# Patient Record
Sex: Male | Born: 1965 | Race: White | Hispanic: No | Marital: Married | Smoking: Never smoker
Health system: Southern US, Community
[De-identification: ages and names within clinical notes are randomized; demographics above are authoritative.]

## PROBLEM LIST (undated history)

## (undated) DIAGNOSIS — M1990 Unspecified osteoarthritis, unspecified site: Secondary | ICD-10-CM

## (undated) HISTORY — PX: KNEE SURGERY: SHX244

---

## 2016-11-07 DIAGNOSIS — Z23 Encounter for immunization: Secondary | ICD-10-CM | POA: Diagnosis not present

## 2017-10-21 ENCOUNTER — Emergency Department (INDEPENDENT_AMBULATORY_CARE_PROVIDER_SITE_OTHER): Payer: BLUE CROSS/BLUE SHIELD

## 2017-10-21 ENCOUNTER — Encounter: Payer: Self-pay | Admitting: Emergency Medicine

## 2017-10-21 ENCOUNTER — Emergency Department
Admission: EM | Admit: 2017-10-21 | Discharge: 2017-10-21 | Disposition: A | Discharge status: Home / Self Care | Payer: BLUE CROSS/BLUE SHIELD | Source: Home / Self Care | Attending: Family Medicine | Admitting: Family Medicine

## 2017-10-21 DIAGNOSIS — M5412 Radiculopathy, cervical region: Secondary | ICD-10-CM | POA: Diagnosis not present

## 2017-10-21 DIAGNOSIS — M4802 Spinal stenosis, cervical region: Secondary | ICD-10-CM | POA: Diagnosis not present

## 2017-10-21 HISTORY — DX: Unspecified osteoarthritis, unspecified site: M19.90

## 2017-10-21 MED ORDER — PREDNISONE 20 MG PO TABS: ORAL_TABLET | ORAL | 0 refills | Status: DC

## 2017-10-21 NOTE — Discharge Instructions (Addendum)
Apply ice to the affected area: °Put ice in a plastic bag. °Place a towel between your skin and the bag. °Leave the ice on for 20 minutes, 2-3 times per day. °

## 2017-10-21 NOTE — ED Triage Notes (Signed)
Pt c/o shoulder pain x1 week. States he has hx of shoulder pain but has not been seen for it in years. Worse and constant in the last week. Denies injury.

## 2017-10-21 NOTE — ED Provider Notes (Signed)
[facility]    CSN: [id number] Arrival date & time: 10/21/17  1191     History   Chief Complaint Chief Complaint  Patient presents with  . Shoulder Pain    HPI Tyrone Williams is a 52 y.o. male.   Patient complains of pain in his left shoulder area for about 1.5 weeks.  He recalls no injury.  He notes that dull pain occurs in his left shoulder and upper back when he extends his neck, flexes his neck to the left, and when her rotates his head to the left.  He has occasional radiation of the pain to his posterior humerus and elbow.   He has a past history of left rotator cuff pain that improved spontaneously.  The history is provided by the patient.  Shoulder Pain  Location:  Shoulder Shoulder location:  L shoulder Injury: no   Pain details:    Quality:  Aching   Radiates to:  L upper arm   Severity:  Mild   Onset quality:  Gradual   Duration:  10 days   Timing:  Intermittent   Progression:  Worsening Handedness:  Left-handed Relieved by:  Nothing Worsened by:  Movement Ineffective treatments:  NSAIDs Associated symptoms: back pain   Associated symptoms: no decreased range of motion, no fatigue, no fever, no muscle weakness, no neck pain, no numbness, no stiffness, no swelling and no tingling     Past Medical History:  Diagnosis Date  . Arthritis     There are no active problems to display for this patient.   Past Surgical History:  Procedure Laterality Date  . KNEE SURGERY         Home Medications    Prior to Admission medications   Medication Sig Start Date End Date Taking? Authorizing Provider  predniSONE (DELTASONE) 20 MG tablet Take one tab by mouth twice daily for 5 days, then one daily for 3 days. Take with food. 10/21/17   Lattie Haw, MD    Family History History reviewed. No pertinent family history.  Social History Social History   Tobacco Use  . Smoking status: Never Smoker  . Smokeless tobacco: Current User    Types:  Snuff  Substance Use Topics  . Alcohol use: Not Currently  . Drug use: Never     Allergies   Patient has no allergy information on record.   Review of Systems Review of Systems  Constitutional: Negative for fatigue, fever and unexpected weight change.  Musculoskeletal: Positive for back pain. Negative for neck pain and stiffness.  All other systems reviewed and are negative.    Physical Exam Triage Vital Signs ED Triage Vitals  Enc Vitals Group     BP 10/21/17 0849 (!) 155/92     Pulse Rate 10/21/17 0849 68     Resp --      Temp 10/21/17 0849 97.8 F (36.6 C)     Temp Source 10/21/17 0849 Oral     SpO2 10/21/17 0849 98 %     Weight 10/21/17 0850 254 lb (115.2 kg)     Height --      Head Circumference --      Peak Flow --      Pain Score --      Pain Loc --      Pain Edu? --      Excl. in GC? --    No data found.  Updated Vital Signs BP (!) 155/92 (BP Location: Right Arm)  Pulse 68   Temp 97.8 F (36.6 C) (Oral)   Wt 115.2 kg   SpO2 98%   Visual Acuity Right Eye Distance:   Left Eye Distance:   Bilateral Distance:    Right Eye Near:   Left Eye Near:    Bilateral Near:     Physical Exam  Constitutional: He appears well-developed and well-nourished. No distress.  HENT:  Head: Atraumatic.  Right Ear: External ear normal.  Left Ear: External ear normal.  Eyes: Pupils are equal, round, and reactive to light.  Neck: Normal range of motion.  Cardiovascular: Normal rate and normal heart sounds.  Pulmonary/Chest: Effort normal and breath sounds normal.  Musculoskeletal:       Left shoulder: Normal. He exhibits normal range of motion and no tenderness.       Arms: Left shoulder has good internal/external rotation, and normal abduction.  Negative Apley's and empty can tests.  No shoulder tenderness to palpation.  Distal neurovascular function is intact.   Lymphadenopathy:    He has no cervical adenopathy.  Neurological: He is alert.  Skin: Skin is warm  and dry. No rash noted.  Nursing note and vitals reviewed.    UC Treatments / Results  Labs (all labs ordered are listed, but only abnormal results are displayed) Labs Reviewed - No data to display  EKG None  Radiology Dg Cervical Spine Complete  Result Date: 10/21/2017 CLINICAL DATA:  Left upper extremity pain EXAM: CERVICAL SPINE - COMPLETE 4+ VIEW COMPARISON:  None. FINDINGS: Uncovertebral spurring causes mild to moderate left neural foraminal narrowing from C3-4 through C6-7 and mild right neural foraminal narrowing at C3-4 and C4-5. Degenerative disc disease at C4-5 and C5-6 with disc space narrowing and spurring. Mild bilateral degenerative facet disease, left greater than right. Normal alignment. No fracture. Prevertebral soft tissues are normal. IMPRESSION: Bilateral neural foraminal narrowing, left greater than right. Degenerative disc and facet disease. No acute bony abnormality. Electronically Signed   By: Charlett Nose M.D.   On: 10/21/2017 09:28    Procedures Procedures (including critical care time)  Medications Ordered in UC Medications - No data to display  Initial Impression / Assessment and Plan / UC Course  I have reviewed the triage vital signs and the nursing notes.  Pertinent labs & imaging results that were available during my care of the patient were reviewed by me and considered in my medical decision making (see chart for details).    Begin prednisone burst/taper. Followup with Dr. Rodney Langton or Dr. Clementeen Graham ([facility]) for further evaluation.   Final Clinical Impressions(s) / UC Diagnoses   Final diagnoses:  Cervical radiculopathy     Discharge Instructions      Apply ice to the affected area. ? Put ice in a plastic bag. ? Place a towel between your skin and the bag. ? Leave the ice on for 20 minutes, 2-3 times per day.    ED Prescriptions    Medication Sig Dispense Auth. Provider   predniSONE (DELTASONE) 20 MG  tablet Take one tab by mouth twice daily for 5 days, then one daily for 3 days. Take with food. 13 tablet Lattie Haw, MD         Lattie Haw, MD 10/21/17 1002

## 2017-10-28 ENCOUNTER — Other Ambulatory Visit: Payer: Self-pay

## 2017-10-28 ENCOUNTER — Encounter: Payer: Self-pay | Admitting: Family Medicine

## 2017-10-28 ENCOUNTER — Ambulatory Visit: Payer: BLUE CROSS/BLUE SHIELD | Admitting: Family Medicine

## 2017-10-28 VITALS — BP 142/79 | HR 92 | Ht 72.0 in | Wt 252.0 lb

## 2017-10-28 DIAGNOSIS — M5412 Radiculopathy, cervical region: Secondary | ICD-10-CM | POA: Diagnosis not present

## 2017-10-28 MED ORDER — GABAPENTIN 300 MG PO CAPS: ORAL_CAPSULE | ORAL | 3 refills | Status: DC

## 2017-10-28 MED ORDER — PREDNISONE 10 MG (48) PO TBPK: ORAL_TABLET | Freq: Every day | ORAL | 0 refills | Status: DC

## 2017-10-28 MED ORDER — GABAPENTIN 300 MG PO CAPS: ORAL_CAPSULE | ORAL | 3 refills | Status: AC

## 2017-10-28 NOTE — Patient Instructions (Signed)
Thank you for coming in today. Attend PT.  Do home exercises.  Restart prednisone  Start gabapentin.  Recheck with me in 4 weeks.  Return sooner if not doing well.  Come back or go to the emergency room if you notice new weakness new numbness problems walking or bowel or bladder problems.   Cervical Radiculopathy Cervical radiculopathy happens when a nerve in the neck (cervical nerve) is pinched or bruised. This condition can develop because of an injury or as part of the normal aging process. Pressure on the cervical nerves can cause pain or numbness that runs from the neck all the way down into the arm and fingers. Usually, this condition gets better with rest. Treatment may be needed if the condition does not improve. What are the causes? This condition may be caused by:  Injury.  Slipped (herniated) disk.  Muscle tightness in the neck because of overuse.  Arthritis.  Breakdown or degeneration in the bones and joints of the spine (spondylosis) due to aging.  Bone spurs that may develop near the cervical nerves.  What are the signs or symptoms? Symptoms of this condition include:  Pain that runs from the neck to the arm and hand. The pain can be severe or irritating. It may be worse when the neck is moved.  Numbness or weakness in the affected arm and hand.  How is this diagnosed? This condition may be diagnosed based on symptoms, medical history, and a physical exam. You may also have tests, including:  X-rays.  CT scan.  MRI.  Electromyogram (EMG).  Nerve conduction tests.  How is this treated? In many cases, treatment is not needed for this condition. With rest, the condition usually gets better over time. If treatment is needed, options may include:  Wearing a soft neck collar for short periods of time.  Physical therapy to strengthen your neck muscles.  Medicines, such as NSAIDs, oral corticosteroids, or spinal injections.  Surgery. This may be needed if  other treatments do not help. Various types of surgery may be done depending on the cause of your problems.  Follow these instructions at home: Managing pain  Take over-the-counter and prescription medicines only as told by your health care provider.  If directed, apply ice to the affected area. ? Put ice in a plastic bag. ? Place a towel between your skin and the bag. ? Leave the ice on for 20 minutes, 2-3 times per day.  If ice does not help, you can try using heat. Take a warm shower or warm bath, or use a heat pack as told by your health care provider.  Try a gentle neck and shoulder massage to help relieve symptoms. Activity  Rest as needed. Follow instructions from your health care provider about any restrictions on activities.  Do stretching and strengthening exercises as told by your health care provider or physical therapist. General instructions  If you were given a soft collar, wear it as told by your health care provider.  Use a flat pillow when you sleep.  Keep all follow-up visits as told by your health care provider. This is important. Contact a health care provider if:  Your condition does not improve with treatment. Get help right away if:  Your pain gets much worse and cannot be controlled with medicines.  You have weakness or numbness in your hand, arm, face, or leg.  You have a high fever.  You have a stiff, rigid neck.  You lose control of your  bowels or your bladder (have incontinence).  You have trouble with walking, balance, or speaking. This information is not intended to replace advice given to you by your health care provider. Make sure you discuss any questions you have with your health care provider. Document Released: 09/26/2000 Document Revised: 06/09/2015 Document Reviewed: 02/25/2014 Elsevier Interactive Patient Education  [employer].

## 2017-10-28 NOTE — Progress Notes (Signed)
Rx cancelled at [facility] with [place].

## 2017-10-29 ENCOUNTER — Encounter: Payer: Self-pay | Admitting: Family Medicine

## 2017-10-29 DIAGNOSIS — M5412 Radiculopathy, cervical region: Secondary | ICD-10-CM | POA: Insufficient documentation

## 2017-10-29 NOTE — Progress Notes (Signed)
Subjective:    CC: Cervical radiculopathy  HPI: Tyrone Williams has a several week history of pain in the left lateral posterior neck and shoulder radiating down the left arm to the left ulnar hand.  He notes symptoms are worse with neck motion.  He denies any injury weakness or numbness.  No fevers chills nausea vomiting or diarrhea.  He was seen in urgent care on [address] where he had an x-ray of his cervical spine that showed some degenerative changes at C5-6 with some neuroforaminal stenosis.  He was prescribed prednisone which he notes has been helpful but as the course is completing notes that is starting to wear off again.  Symptoms are moderate and do occasionally interfere with activities at home and at work.  Past medical history, Surgical history, Family history not pertinant except as noted below, Social history, Allergies, and medications have been entered into the medical record, reviewed, and no changes needed.   Review of Systems: No headache, visual changes, nausea, vomiting, diarrhea, constipation, dizziness, abdominal pain, skin rash, fevers, chills, night sweats, weight loss, swollen lymph nodes, body aches, joint swelling, muscle aches, chest pain, shortness of breath, mood changes, visual or auditory hallucinations.   Objective:    Vitals:   10/28/17 1435  BP: (!) 142/79  Pulse: 92   General: Well Developed, well nourished, and in no acute distress.  Neuro/Psych: Alert and oriented x3, extra-ocular muscles intact, able to move all 4 extremities, sensation grossly intact. Skin: Warm and dry, no rashes noted.  Respiratory: Not using accessory muscles, speaking in full sentences, trachea midline.  Cardiovascular: Pulses palpable, no extremity edema. Abdomen: Does not appear distended. MSK:  C-spine nontender to spinal midline.  Tender to palpation left cervical paraspinal musculature and trapezius. Neck range of motion normal at the exception of lateral flexion and  rotation and extension which are painful. Positive left-sided Spurling's test.   Upper extremity strength reflexes and sensation are equal normal throughout.  Left shoulder normal appearing nontender.  Normal shoulder motion.  Negative impingement testing normal strength.   Lab and Radiology Results EXAM: CERVICAL SPINE - COMPLETE 4+ VIEW  COMPARISON:  None.  FINDINGS: Uncovertebral spurring causes mild to moderate left neural foraminal narrowing from C3-4 through C6-7 and mild right neural foraminal narrowing at C3-4 and C4-5. Degenerative disc disease at C4-5 and C5-6 with disc space narrowing and spurring. Mild bilateral degenerative facet disease, left greater than right. Normal alignment. No fracture. Prevertebral soft tissues are normal.  IMPRESSION: Bilateral neural foraminal narrowing, left greater than right. Degenerative disc and facet disease. No acute bony abnormality.   Electronically Signed   By: Charlett Nose M.D.   On: 10/21/2017 09:28 I personally (independently) visualized and performed the interpretation of the images attached in this note.   Impression and Recommendations:    Assessment and Plan: 52 y.o. male with  Left posterior shoulder and arm pain is very likely cervical radiculopathy.  Patient has symptoms radiating to the ulnar hand which would be more indicative of C8 dermatome.  X-ray of cervical spine indicate that there is neuroforaminal stenosis that would involve C5 or C6 dermatome.  Regardless we will proceed with physical therapy, home exercise program.  Additional prescribed gabapentin and repeat course of steroids and a slightly longer course using a 12-day Dosepak.  Recheck in about a month.  Return sooner if needed.  Next step would be MRI for epidural steroid injection planning..   Orders Placed This Encounter  Procedures  .  Ambulatory referral to Physical Therapy    Referral Priority:   Routine    Referral Type:   Physical  Medicine    Referral Reason:   Specialty Services Required    Requested Specialty:   Physical Therapy   Meds ordered this encounter  Medications  . DISCONTD: predniSONE (STERAPRED UNI-PAK 48 TAB) 10 MG (48) TBPK tablet    Sig: Take by mouth daily. 12 day dosepack po    Dispense:  48 tablet    Refill:  0  . DISCONTD: gabapentin (NEURONTIN) 300 MG capsule    Sig: One tab PO qHS for a week, then BID for a week, then TID. May double weekly to a max of 3,600mg /day    Dispense:  180 capsule    Refill:  3    Discussed warning signs or symptoms. Please see discharge instructions. Patient expresses understanding.

## 2017-11-08 ENCOUNTER — Encounter: Payer: Self-pay | Admitting: Rehabilitative and Restorative Service Providers"

## 2017-11-08 ENCOUNTER — Ambulatory Visit: Payer: BLUE CROSS/BLUE SHIELD | Admitting: Rehabilitative and Restorative Service Providers"

## 2017-11-08 DIAGNOSIS — R293 Abnormal posture: Secondary | ICD-10-CM | POA: Diagnosis not present

## 2017-11-08 DIAGNOSIS — R29898 Other symptoms and signs involving the musculoskeletal system: Secondary | ICD-10-CM | POA: Diagnosis not present

## 2017-11-08 DIAGNOSIS — M5412 Radiculopathy, cervical region: Secondary | ICD-10-CM

## 2017-11-08 NOTE — Therapy (Signed)
[facility] [address] [place], [place], [address] Phone: [phone]   Fax:  [phone]  Physical Therapy Evaluation  Patient Details  Name: Tyrone Williams MRN: [id number] Date of Birth: 1965/02/27 Referring Provider (PT): Dr Clementeen Graham    Encounter Date: 11/08/2017  PT End of Session - 11/08/17 0808    Visit Number  1    Number of Visits  12    Date for PT Re-Evaluation  12/20/17    PT Start Time  0807    PT Stop Time  0925    PT Time Calculation (min)  78 min    Activity Tolerance  Patient tolerated treatment well       Past Medical History:  Diagnosis Date  . Arthritis     Past Surgical History:  Procedure Laterality Date  . KNEE SURGERY      There were no vitals filed for this visit.   Subjective Assessment - 11/08/17 0815    Subjective  Patient reports that he noticed a "crick" in his neck about 4-5 yrs ago. He began to have pain and symptoms in the Lt UE which persisted and increased in the past few weeks. He has some improvement with medication.     Pertinent History  unremarkable per pt report     Diagnostic tests  xrays - arthritis; degenerative changes; foraminal narrowing; DDD     Patient Stated Goals  improve symptoms     Currently in Pain?  Yes    Pain Score  3     Pain Location  Neck    Pain Orientation  Left    Pain Descriptors / Indicators  Sharp;Pins and needles    Pain Radiating Towards  into the Lt posterior shoulder; elbow; tingling into the Lt lateral forearm and ring/little fingers     Pain Onset  More than a month ago    Pain Frequency  Intermittent    Aggravating Factors   bringing head back or to the Lt; moving through the day    Pain Relieving Factors  sitting in the recliner with head forward; meds         [facility] PT Assessment - 11/08/17 0001      Assessment   Medical Diagnosis  Cervical radiculopathy    Referring Provider (PT)  Dr Clementeen Graham     Onset Date/Surgical Date   09/29/17    Hand Dominance  Left    Next MD Visit  11/25/17    Prior Therapy  no       Precautions   Precautions  None      Balance Screen   Has the patient fallen in the past 6 months  No    Has the patient had a decrease in activity level because of a fear of falling?   No    Is the patient reluctant to leave their home because of a fear of falling?   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 less in the past 6 months     Leisure  hunting; camping; vacation home repair; recliner       Observation/Other Assessments   Focus on Therapeutic Outcomes (FOTO)   41% limitation       Sensation   Additional Comments  tingling Lt  ring and little fingers intermittently       Posture/Postural Control   Posture Comments  head forward; shoulders rounded and elevated      AROM   Overall AROM Comments  bilat UE's WFLs - tight end ranges     Cervical Flexion  57    Cervical Extension  37 pain     Cervical - Right Side Bend  38    Cervical - Left Side Bend  24 pain     Cervical - Right Rotation  72    Cervical - Left Rotation  52 discomfort       Strength   Overall Strength Comments  5/5 bilat UE's       Palpation   Spinal mobility  hypomobile thoracic and cervical spine with CPA mobs     Palpation comment  muscular tightness Lt > Rt ant/lat/post cervical spine musculature; upper traps; leveator; pecs                 Objective measurements completed on examination: See above findings.      [facility] Adult PT Treatment/Exercise - 11/08/17 0001      Neuro Re-ed    Neuro Re-ed Details   working on posture and alignment in sitting and standing       Shoulder Exercises: Standing   Other Standing Exercises  axial extension to tolerance of radicular symptoms 5 sec x 5; scap squeeze 10 sec x 5; L's x 10; W's x 10 with swim noodle        Shoulder Exercises: Stretch   Other Shoulder Stretches  3 way doorway stretch 20 sec x 2 each position       Moist Heat Therapy   Number Minutes Moist Heat  20 Minutes    Moist Heat Location  Cervical   thoracic     Electrical Stimulation   Electrical Stimulation Location  Lt cervical     Electrical Stimulation Action  IFC    Electrical Stimulation Parameters  to tolerance    Electrical Stimulation Goals  Pain;Tone      Manual Therapy   Manual therapy comments  pt supine head supported on pillow     Joint Mobilization  Cervical CPA mobs Lt to Rt lateral mobs     Soft tissue mobilization  deep tissue work Lt ant/lat/post cervical musculature              PT Education - 11/08/17 0844    Education Details  HEP TENS     Person(s) Educated  Patient    Methods  Explanation;Demonstration;Tactile cues;Verbal cues;Handout    Comprehension  Verbalized understanding;Returned demonstration;Verbal cues required;Tactile cues required          PT Long Term Goals - 11/08/17 0947      PT LONG TERM GOAL #1   Title  Improve posture and alignment with patient to demonstrate improved upright posture with good axial extension without radicular symptoms 12/20/17    Time  6    Period  Weeks    Status  New      PT LONG TERM GOAL #2   Title  Decrease frequency; intensity; duration of Lt UE pain and radicular symptoms by 75-100% allowing patient to perfrom normal daily activities without pain/limitations 12/20/17    Time  6    Period  Weeks    Status  New      PT LONG TERM GOAL #3   Title  Increase cervical ROM in Lt lateral flexion and  rotation to equal Rt without radicular symptoms 12/20/17    Time  6    Period  Weeks    Status  New      PT LONG TERM GOAL #4   Title  Independent in HEP 12/20/17    Time  6    Period  Weeks    Status  New      PT LONG TERM GOAL #5   Title  Imporve FOTO to </= 29% limitation 12/20/17    Time  6    Period  Weeks    Status  New              Plan - 11/08/17 0931    Clinical Impression Statement  Tyrone Williams presents with signs and symptoms consistent with cervical radiculopathy with discogenic and muscular components. He has cervical tightness and limited ROM as well as Lt UE radicular symptoms. Patient has poor posture and alignment; limited mobility; pain. Patient will benefit from PT to address problems identified.     History and Personal Factors relevant to plan of care:  bilat shoulder and knee problems     Clinical Presentation  Evolving    Clinical Decision Making  Low    Rehab Potential  Good    PT Frequency  2x / week    PT Duration  6 weeks    PT Treatment/Interventions  Patient/family education;ADLs/Self Care Home Management;Cryotherapy;Electrical Stimulation;Iontophoresis 4mg /ml Dexamethasone;Moist Heat;Traction;Ultrasound;Spinal Manipulations;Manual techniques;Neuromuscular re-education;Therapeutic activities;Therapeutic exercise    PT Next Visit Plan  continue with postural correction; ROM; stretching; deep tissue work/DN cervical and shoulder girdle area; HEP; modalities as indicated     Consulted and Agree with Plan of Care  Patient       Patient will benefit from skilled therapeutic intervention in order to improve the following deficits and impairments:  Postural dysfunction, Improper body mechanics, Pain, Increased fascial restricitons, Increased muscle spasms, Decreased mobility, Decreased range of motion, Decreased activity tolerance  Visit Diagnosis: Radiculopathy, cervical region - Plan: PT plan of care cert/re-cert  Other symptoms and signs involving the musculoskeletal system - Plan: PT plan of care cert/re-cert  Abnormal posture - Plan: PT plan of care cert/re-cert     Problem List Patient Active Problem List   Diagnosis Date Noted  . Cervical radiculitis 10/29/2017    Destin Kittler Rober Minion PT, MPH  11/08/2017, 9:53 AM  [facility] [address] [place], [place], [address] Phone: [phone]   Fax:  [phone]  Name: Tyrone Williams MRN: [id number] Date of Birth: 1965-06-21

## 2017-11-08 NOTE — Patient Instructions (Signed)
Scapula Adduction With Pectoralis Stretch: Low - Standing   Shoulders at 45 hands even with shoulders, keeping weight through legs, shift weight forward until you feel pull or stretch through the front of your chest. Hold _30__ seconds. Do _3__ times, _2-4__ times per day.   Scapula Adduction With Pectoralis Stretch: Mid-Range - Standing   Shoulders at 90 elbows even with shoulders, keeping weight through legs, shift weight forward until you feel pull or strength through the front of your chest. Hold __30_ seconds. Do _3__ times, __2-4_ times per day.   Scapula Adduction With Pectoralis Stretch: High - Standing   Shoulders at 120 hands up high on the doorway, keeping weight on feet, shift weight forward until you feel pull or stretch through the front of your chest. Hold _30__ seconds. Do _3__ times, _2-3__ times per day.   Axial Extension (Chin Tuck) as tolerated    Pull chin in and lengthen back of neck. Hold __5__ seconds while counting out loud. Repeat __10__ times. Do __several__ sessions per day.  Shoulder Blade Squeeze can use swim noodle along spine     Rotate shoulders back, then squeeze shoulder blades down and back. Hold 10 sec Repeat __5-10__ times. Do __several __ sessions per day.  Upper Back Strength: Lower Trapezius / Rotator Cuff " L's "     Arms in waitress pose, palms up. Press hands back and slide shoulder blades down. Hold for __5__ seconds. Repeat _10___ times. 1-2 times per day.    Scapular Retraction: Elbow Flexion (Standing)  "W's"     With elbows bent to 90, pinch shoulder blades together and rotate arms out, keeping elbows bent. Repeat __10__ times per set. Do __1-2__ sets per session. Do _several ___ sessions per day.   TENS UNIT: This is helpful for muscle pain and spasm.   Search and Purchase a TENS 7000 2nd edition at www.tenspros.com. It should be less than $30.     TENS unit instructions: Do not shower or bathe with the  unit on Turn the unit off before removing electrodes or batteries If the electrodes lose stickiness add a drop of water to the electrodes after they are disconnected from the unit and place on plastic sheet. If you continued to have difficulty, call the TENS unit company to purchase more electrodes. Do not apply lotion on the skin area prior to use. Make sure the skin is clean and dry as this will help prolong the life of the electrodes. After use, always check skin for unusual red areas, rash or other skin difficulties. If there are any skin problems, does not apply electrodes to the same area. Never remove the electrodes from the unit by pulling the wires. Do not use the TENS unit or electrodes other than as directed. Do not change electrode placement without consultating your therapist or physician. Keep 2 fingers with between each electrode.     [facility] at [facility] [address] [place], [place] [address]  [phone] (office) [phone] (fax)

## 2017-11-11 ENCOUNTER — Ambulatory Visit: Payer: BLUE CROSS/BLUE SHIELD | Admitting: Rehabilitative and Restorative Service Providers"

## 2017-11-11 ENCOUNTER — Encounter: Payer: Self-pay | Admitting: Rehabilitative and Restorative Service Providers"

## 2017-11-11 DIAGNOSIS — R293 Abnormal posture: Secondary | ICD-10-CM

## 2017-11-11 DIAGNOSIS — M5412 Radiculopathy, cervical region: Secondary | ICD-10-CM

## 2017-11-11 DIAGNOSIS — R29898 Other symptoms and signs involving the musculoskeletal system: Secondary | ICD-10-CM | POA: Diagnosis not present

## 2017-11-11 NOTE — Therapy (Signed)
[facility] [place] [address] [place], [place], [address] Phone: [phone]   Fax:  [phone]  Physical Therapy Treatment  Patient Details  Name: Lavan Imes MRN: [id number] Date of Birth: 04-25-1965 Referring Provider (PT): Dr Clementeen Graham    Encounter Date: 11/11/2017  PT End of Session - 11/11/17 0802    Visit Number  2    Number of Visits  12    Date for PT Re-Evaluation  12/20/17    PT Start Time  0800    PT Stop Time  0859    PT Time Calculation (min)  59 min    Activity Tolerance  Patient tolerated treatment well       Past Medical History:  Diagnosis Date  . Arthritis     Past Surgical History:  Procedure Laterality Date  . KNEE SURGERY      There were no vitals filed for this visit.  Subjective Assessment - 11/11/17 0803    Subjective  Patient reports that he had the best half day after he left therapy that he has had in a long time. He has been working on his exercises at home and has modified his sitting poisitions at home. Notices some soreness in the neck area from the soft tissue work. Trying to work on his posture and alignment.     Currently in Pain?  No/denies                       [facility] Adult PT Treatment/Exercise - 11/11/17 0001      Shoulder Exercises: Standing   Other Standing Exercises  axial extension to tolerance of radicular symptoms 5 sec x 5; scap squeeze 10 sec x 5; L's x 10; W's x 10 with swim noodle       Shoulder Exercises: Stretch   Other Shoulder Stretches  3 way doorway stretch 20 sec x 2 each position       Moist Heat Therapy   Number Minutes Moist Heat  20 Minutes    Moist Heat Location  Cervical   thoracic     Electrical Stimulation   Electrical Stimulation Location  Lt cervical     Electrical Stimulation Action  IFC    Electrical Stimulation Parameters  to tolerance    Electrical Stimulation Goals  Pain;Tone      Manual Therapy   Manual therapy  comments  pt supine head supported on pillow     Joint Mobilization  Cervical CPA mobs Lt to Rt lateral mobs     Soft tissue mobilization  deep tissue work Lt ant/lat/post cervical musculature        Trigger Point Dry Needling - 11/11/17 0840    Consent Given?  Yes    Education Handout Provided  Yes    Muscles Treated Upper Body  Scalenes   Lt x 2 needles    Scalenes Response  Palpable increased muscle length           PT Education - 11/11/17 0824    Education Details  DN    Person(s) Educated  Patient    Methods  Explanation    Comprehension  Verbalized understanding          PT Long Term Goals - 11/08/17 0947      PT LONG TERM GOAL #1   Title  Improve posture and alignment with patient to demonstrate improved upright posture with good axial extension without radicular symptoms 12/20/17  Time  6    Period  Weeks    Status  New      PT LONG TERM GOAL #2   Title  Decrease frequency; intensity; duration of Lt UE pain and radicular symptoms by 75-100% allowing patient to perfrom normal daily activities without pain/limitations 12/20/17    Time  6    Period  Weeks    Status  New      PT LONG TERM GOAL #3   Title  Increase cervical ROM in Lt lateral flexion and rotation to equal Rt without radicular symptoms 12/20/17    Time  6    Period  Weeks    Status  New      PT LONG TERM GOAL #4   Title  Independent in HEP 12/20/17    Time  6    Period  Weeks    Status  New      PT LONG TERM GOAL #5   Title  Imporve FOTO to </= 29% limitation 12/20/17    Time  6    Period  Weeks    Status  New            Plan - 11/11/17 0802    Clinical Impression Statement  Patient reports some improvement with initial treatment and decrease in radicular symptoms. He is working on the exercises at home. Good response to initial treatment and manual work.     Rehab Potential  Good    PT Frequency  2x / week    PT Duration  6 weeks    PT Treatment/Interventions  Patient/family  education;ADLs/Self Care Home Management;Cryotherapy;Electrical Stimulation;Iontophoresis 4mg /ml Dexamethasone;Moist Heat;Traction;Ultrasound;Spinal Manipulations;Manual techniques;Neuromuscular re-education;Therapeutic activities;Therapeutic exercise    PT Next Visit Plan  continue with postural correction; ROM; stretching; deep tissue work/DN cervical and shoulder girdle area; HEP; modalities as indicated Assess response to DN lt lateral cervical mm    Consulted and Agree with Plan of Care  Patient       Patient will benefit from skilled therapeutic intervention in order to improve the following deficits and impairments:  Postural dysfunction, Improper body mechanics, Pain, Increased fascial restricitons, Increased muscle spasms, Decreased mobility, Decreased range of motion, Decreased activity tolerance  Visit Diagnosis: Radiculopathy, cervical region  Other symptoms and signs involving the musculoskeletal system  Abnormal posture     Problem List Patient Active Problem List   Diagnosis Date Noted  . Cervical radiculitis 10/29/2017    Tieshia Rettinger Rober Minion PT, MPH  11/11/2017, 8:42 AM  [facility] [address] [place] [address] [place], [place], [address] Phone: [phone]   Fax:  [phone]  Name: Travares Nelles MRN: [id number] Date of Birth: April 02, 1965

## 2017-11-11 NOTE — Patient Instructions (Signed)

## 2017-11-13 ENCOUNTER — Ambulatory Visit: Payer: BLUE CROSS/BLUE SHIELD | Admitting: Rehabilitative and Restorative Service Providers"

## 2017-11-13 ENCOUNTER — Encounter: Payer: Self-pay | Admitting: Rehabilitative and Restorative Service Providers"

## 2017-11-13 DIAGNOSIS — R293 Abnormal posture: Secondary | ICD-10-CM | POA: Diagnosis not present

## 2017-11-13 DIAGNOSIS — R29898 Other symptoms and signs involving the musculoskeletal system: Secondary | ICD-10-CM | POA: Diagnosis not present

## 2017-11-13 DIAGNOSIS — M5412 Radiculopathy, cervical region: Secondary | ICD-10-CM

## 2017-11-13 NOTE — Therapy (Signed)
[facility] [place] [address] [address] [place], [place], [address] Phone: [phone]   Fax:  [phone]  Physical Therapy Treatment  Patient Details  Name: Tyrone Williams MRN: [id number] Date of Birth: 08-15-1965 Referring Provider (PT): Dr Clementeen Graham    Encounter Date: 11/13/2017  PT End of Session - 11/13/17 0748    Visit Number  3    Number of Visits  12    Date for PT Re-Evaluation  12/20/17    PT Start Time  0748    PT Stop Time  0838    PT Time Calculation (min)  50 min    Activity Tolerance  Patient tolerated treatment well       Past Medical History:  Diagnosis Date  . Arthritis     Past Surgical History:  Procedure Laterality Date  . KNEE SURGERY      There were no vitals filed for this visit.  Subjective Assessment - 11/13/17 0749    Subjective  Some wierd feeling for a couple of hours after last treatment - related to the DN. Good day the rest of the day and yesterday. Still has tingling with tipping head back - looking in the fridge this morning. Patient feels the DN did help - still some soreness and doesn't want to do the DN today.     Currently in Pain?  No/denies         [facility] PT Assessment - 11/13/17 0001      Assessment   Medical Diagnosis  Cervical radiculopathy    Referring Provider (PT)  Dr Clementeen Graham     Onset Date/Surgical Date  09/29/17    Hand Dominance  Left    Next MD Visit  11/25/17    Prior Therapy  no       Sensation   Additional Comments  tingling Lt ring and little fingers intermittently; elbow pain intermittently       Palpation   Spinal mobility  hypomobile thoracic and cervical spine with CPA mobs     Palpation comment  muscular tightness Lt > Rt ant/lat/post cervical spine musculature; upper traps; leveator; pecs                    [facility] Adult PT Treatment/Exercise - 11/13/17 0001      Neuro Re-ed    Neuro Re-ed Details   working on posture and alignment in  sitting and standing       Shoulder Exercises: Standing   Other Standing Exercises  axial extension to tolerance of radicular symptoms 5 sec x 5; scap squeeze 10 sec x 5; L's x 10; W's x 10 with swim noodle       Shoulder Exercises: Stretch   Other Shoulder Stretches  3 way doorway stretch 20 sec x 2 each position       Moist Heat Therapy   Number Minutes Moist Heat  15 Minutes    Moist Heat Location  Cervical   thoracic     Electrical Stimulation   Electrical Stimulation Location  Lt cervical     Electrical Stimulation Action  IFC    Electrical Stimulation Parameters  to tolerance    Electrical Stimulation Goals  Pain;Tone      Ultrasound   Ultrasound Location  Lt lateral cervical musculature    Ultrasound Parameters  1.5 w/cm2; 1 mHz; 100%; 8 min     Ultrasound Goals  Pain;Other (Comment)   tightness      Manual  Therapy   Manual therapy comments  pt sitting and supine head supported on pillow     Soft tissue mobilization  IASTM Lt posterior lateral cervical spine; deep tissue work Lt ant/lat/post cervical musculature                   PT Long Term Goals - 11/08/17 0947      PT LONG TERM GOAL #1   Title  Improve posture and alignment with patient to demonstrate improved upright posture with good axial extension without radicular symptoms 12/20/17    Time  6    Period  Weeks    Status  New      PT LONG TERM GOAL #2   Title  Decrease frequency; intensity; duration of Lt UE pain and radicular symptoms by 75-100% allowing patient to perfrom normal daily activities without pain/limitations 12/20/17    Time  6    Period  Weeks    Status  New      PT LONG TERM GOAL #3   Title  Increase cervical ROM in Lt lateral flexion and rotation to equal Rt without radicular symptoms 12/20/17    Time  6    Period  Weeks    Status  New      PT LONG TERM GOAL #4   Title  Independent in HEP 12/20/17    Time  6    Period  Weeks    Status  New      PT LONG TERM GOAL #5    Title  Imporve FOTO to </= 29% limitation 12/20/17    Time  6    Period  Weeks    Status  New            Plan - 11/13/17 0749    Clinical Impression Statement  Decreased palpable tightness noted Lt ant/lat/post cervical musculature. Continued radicular symptoms with functional activities and certain postures as well as some of the manual work. Paitent has increased radicular pain with manual cervical traction. Will benefit from continued DN and manual work to address muscular component of cervical radiculopathy and allow improved cervical posture and alignment.     Rehab Potential  Good    PT Frequency  2x / week    PT Duration  6 weeks    PT Treatment/Interventions  Patient/family education;ADLs/Self Care Home Management;Cryotherapy;Electrical Stimulation;Iontophoresis 4mg /ml Dexamethasone;Moist Heat;Traction;Ultrasound;Spinal Manipulations;Manual techniques;Neuromuscular re-education;Therapeutic activities;Therapeutic exercise    PT Next Visit Plan  continue with postural correction; ROM; stretching; deep tissue work/DN cervical and shoulder girdle area; HEP; modalities as indicated Continue DN lt lateral cervical mm    Consulted and Agree with Plan of Care  Patient       Patient will benefit from skilled therapeutic intervention in order to improve the following deficits and impairments:  Postural dysfunction, Improper body mechanics, Pain, Increased fascial restricitons, Increased muscle spasms, Decreased mobility, Decreased range of motion, Decreased activity tolerance  Visit Diagnosis: Radiculopathy, cervical region  Other symptoms and signs involving the musculoskeletal system  Abnormal posture     Problem List Patient Active Problem List   Diagnosis Date Noted  . Cervical radiculitis 10/29/2017    Tyrone Williams PT, MPH 11/13/2017, 8:35 AM  [facility] [address] [address] [place], [place], [address] Phone:  [phone]   Fax:  [phone]  Name: Tyrone Williams MRN: [id number] Date of Birth: September 26, 1965

## 2017-11-14 ENCOUNTER — Encounter: Payer: BLUE CROSS/BLUE SHIELD | Admitting: Rehabilitative and Restorative Service Providers"

## 2017-11-19 ENCOUNTER — Encounter: Payer: Self-pay | Admitting: Rehabilitative and Restorative Service Providers"

## 2017-11-19 ENCOUNTER — Ambulatory Visit: Payer: BLUE CROSS/BLUE SHIELD | Admitting: Rehabilitative and Restorative Service Providers"

## 2017-11-19 DIAGNOSIS — M5412 Radiculopathy, cervical region: Secondary | ICD-10-CM | POA: Diagnosis not present

## 2017-11-19 DIAGNOSIS — R293 Abnormal posture: Secondary | ICD-10-CM | POA: Diagnosis not present

## 2017-11-19 DIAGNOSIS — R29898 Other symptoms and signs involving the musculoskeletal system: Secondary | ICD-10-CM

## 2017-11-19 NOTE — Therapy (Signed)
[facility] [place] [address] [address] [place], [place], [address] Phone: [phone]   Fax:  [phone]  Physical Therapy Treatment  Patient Details  Name: Daniell Mancinas MRN: [id number] Date of Birth: October 02, 1965 Referring Provider (PT): Dr Clementeen Graham    Encounter Date: 11/19/2017  PT End of Session - 11/19/17 0804    Visit Number  4    Number of Visits  12    Date for PT Re-Evaluation  12/20/17    PT Start Time  0801    PT Stop Time  0900    PT Time Calculation (min)  59 min       Past Medical History:  Diagnosis Date  . Arthritis     Past Surgical History:  Procedure Laterality Date  . KNEE SURGERY      There were no vitals filed for this visit.  Subjective Assessment - 11/19/17 0805    Subjective  Patient reports that he had a rough weekend - was replacing an oven door and in some awkward positions. He felt some better yesterday. Continues to have radicular symptoms. Generally having less radicular pain into the Lt UE.     Currently in Pain?  Yes    Pain Score  1     Pain Location  Neck    Pain Orientation  Left    Pain Descriptors / Indicators  Aching;Tingling                       [facility] Adult PT Treatment/Exercise - 11/19/17 0001      Shoulder Exercises: Standing   Retraction  Strengthening;Both;20 reps;Theraband    Theraband Level (Shoulder Retraction)  Level 1 (Yellow)    Other Standing Exercises  axial extension to tolerance of radicular symptoms 5 sec x 5; scap squeeze 10 sec x 5; L's x 10; W's x 10 with swim noodle       Shoulder Exercises: ROM/Strengthening   UBE (Upper Arm Bike)  L2 x 3 min - increased symptoms toward end of time       Shoulder Exercises: Stretch   Other Shoulder Stretches  3 way doorway stretch 20 sec x 2 each position       Moist Heat Therapy   Number Minutes Moist Heat  20 Minutes    Moist Heat Location  Cervical   thoracic     Electrical Stimulation   Electrical  Stimulation Location  Lt cervical     Electrical Stimulation Action  IFC    Electrical Stimulation Parameters  to tolerance    Electrical Stimulation Goals  Pain;Tone      Manual Therapy   Manual therapy comments  pt prone and supine     Joint Mobilization  cervical CPA mobs increased symptoms     Soft tissue mobilization  deep tissue work through Lt posterior lateral cervical spine; deep tissue work Lt ant/lat/post cervical musculature        Trigger Point Dry Needling - 11/19/17 0843    Consent Given?  Yes    Muscles Treated Upper Body  --   Lt    Sternocleidomastoid Response  Palpable increased muscle length    Scalenes Response  Palpable increased muscle length    Upper Trapezius Response  Palpable increased muscle length    Longissimus Response  Palpable increased muscle length           PT Education - 11/19/17 0844    Education Details  HEP  Person(s) Educated  Patient    Methods  Explanation;Demonstration;Tactile cues;Verbal cues;Handout    Comprehension  Verbalized understanding;Returned demonstration;Verbal cues required;Tactile cues required          PT Long Term Goals - 11/08/17 0947      PT LONG TERM GOAL #1   Title  Improve posture and alignment with patient to demonstrate improved upright posture with good axial extension without radicular symptoms 12/20/17    Time  6    Period  Weeks    Status  New      PT LONG TERM GOAL #2   Title  Decrease frequency; intensity; duration of Lt UE pain and radicular symptoms by 75-100% allowing patient to perfrom normal daily activities without pain/limitations 12/20/17    Time  6    Period  Weeks    Status  New      PT LONG TERM GOAL #3   Title  Increase cervical ROM in Lt lateral flexion and rotation to equal Rt without radicular symptoms 12/20/17    Time  6    Period  Weeks    Status  New      PT LONG TERM GOAL #4   Title  Independent in HEP 12/20/17    Time  6    Period  Weeks    Status  New      PT  LONG TERM GOAL #5   Title  Imporve FOTO to </= 29% limitation 12/20/17    Time  6    Period  Weeks    Status  New            Plan - 11/19/17 0805    Clinical Impression Statement  Persistent intermittent radicular symptoms Lt UE. Working on posture and alignment. Some benefit from PT but not full resolution.  Continues to have forward head posture with slight Rt lateral flexin and rotation. Does not tolerate manual traction well so mechanical traction is not indicated.     Rehab Potential  Good    PT Frequency  2x / week    PT Duration  6 weeks    PT Treatment/Interventions  Patient/family education;ADLs/Self Care Home Management;Cryotherapy;Electrical Stimulation;Iontophoresis 4mg /ml Dexamethasone;Moist Heat;Traction;Ultrasound;Spinal Manipulations;Manual techniques;Neuromuscular re-education;Therapeutic activities;Therapeutic exercise    PT Next Visit Plan  continue with postural correction; ROM; stretching; deep tissue work/DN cervical and shoulder girdle area; HEP; modalities as indicated Continue DN lt lateral cervical mm measure for MD note at next visit.     Consulted and Agree with Plan of Care  Patient       Patient will benefit from skilled therapeutic intervention in order to improve the following deficits and impairments:  Postural dysfunction, Improper body mechanics, Pain, Increased fascial restricitons, Increased muscle spasms, Decreased mobility, Decreased range of motion, Decreased activity tolerance  Visit Diagnosis: Radiculopathy, cervical region  Other symptoms and signs involving the musculoskeletal system  Abnormal posture     Problem List Patient Active Problem List   Diagnosis Date Noted  . Cervical radiculitis 10/29/2017    Celyn Rober Minion PT, MPH  11/19/2017, 8:45 AM  [facility] [address] [place] [address] [place], [place], [address] Phone: [phone]   Fax:  [phone]  Name: Yidel Teuscher MRN:  [id number] Date of Birth: [birth year]

## 2017-11-19 NOTE — Patient Instructions (Signed)
Resisted External Rotation: in Neutral - Bilateral   PALMS UP Sit or stand, tubing in both hands, elbows at sides, bent to 90, forearms forward. Pinch shoulder blades together and rotate forearms out SLIGHTLY. Keep elbows at sides. Repeat __10__ times per set. Do _2-3___ sets per session. Do _2-3___ sessions per day.

## 2017-11-21 ENCOUNTER — Ambulatory Visit: Payer: BLUE CROSS/BLUE SHIELD | Admitting: Rehabilitative and Restorative Service Providers"

## 2017-11-21 ENCOUNTER — Encounter: Payer: Self-pay | Admitting: Rehabilitative and Restorative Service Providers"

## 2017-11-21 DIAGNOSIS — R293 Abnormal posture: Secondary | ICD-10-CM | POA: Diagnosis not present

## 2017-11-21 DIAGNOSIS — M5412 Radiculopathy, cervical region: Secondary | ICD-10-CM | POA: Diagnosis not present

## 2017-11-21 DIAGNOSIS — R29898 Other symptoms and signs involving the musculoskeletal system: Secondary | ICD-10-CM

## 2017-11-21 NOTE — Therapy (Addendum)
[place], [place], [address] Phone: [phone]   Fax:  [phone]  Physical Therapy Treatment  Patient Details  Name: Tyrone Williams MRN: [id number] Date of Birth: 10-Sep-1965 Referring Provider (PT): Dr Lynne Leader    Encounter Date: 11/21/2017  PT End of Session - 11/21/17 1706    Visit Number  5    Number of Visits  12    Date for PT Re-Evaluation  12/20/17    PT Start Time  1702    PT Stop Time  1759    PT Time Calculation (min)  57 min    Activity Tolerance  Patient tolerated treatment well       Past Medical History:  Diagnosis Date  . Arthritis     Past Surgical History:  Procedure Laterality Date  . KNEE SURGERY      There were no vitals filed for this visit.  Subjective Assessment - 11/21/17 1710    Subjective  Good and bad days and even parts of the day are better or worse. Feels the therapy has helped the muscles but still has the pain into the Lt UE with certain positions and activities.     Currently in Pain?  No/denies    Pain Location  Neck    Pain Orientation  Left    Pain Descriptors / Indicators  Aching;Tingling    Pain Radiating Towards  Lt posterior shoulder; elbow; tingling into the Lt lateral forearm and ring/little fingers     Pain Onset  More than a month ago    Pain Frequency  Intermittent    Aggravating Factors   bringing head back and to the Lt; worse at the end of the day     Pain Relieving Factors  sitting in the recliner with head forward; meds; TENS may help some          [facility] PT Assessment - 11/21/17 0001      Assessment   Medical Diagnosis  Cervical radiculopathy    Referring Provider (PT)  Dr Lynne Leader     Onset Date/Surgical Date  09/29/17    Hand Dominance  Left    Next MD Visit  11/25/17    Prior Therapy  no       Sensation   Additional Comments  tingling Lt ring and little fingers intermittently; elbow pain intermittently       Posture/Postural Control   Posture Comments  head forward; shoulders rounded and elevated      AROM   Overall AROM Comments  bilat UE's WFLs - tight end ranges     Cervical Flexion  60    Cervical Extension  49   pain in upper trap and into Lt UE    Cervical - Right Side Bend  46    Cervical - Left Side Bend  33   pain upper trap into Lt UE    Cervical - Right Rotation  72    Cervical - Left Rotation  54   discomfort Lt      Strength   Overall Strength Comments  5/5 bilat UE's       Palpation   Spinal mobility  hypomobile thoracic and cervical spine with CPA mobs     Palpation comment  muscular tightness Lt > Rt ant/lat/post cervical spine musculature; SCM; clavicular area; upper traps; leveator; pecs  [facility] Adult PT Treatment/Exercise - 11/21/17 0001      Shoulder Exercises: Standing   Retraction  Strengthening;Both;20 reps;Theraband    Theraband Level (Shoulder Retraction)  Level 1 (Yellow)    Other Standing Exercises  axial extension to tolerance of radicular symptoms 5 sec x 5; scap squeeze 10 sec x 5; L's x 10; W's x 10 with swim noodle       Shoulder Exercises: ROM/Strengthening   UBE (Upper Arm Bike)  L2 x 3 min - increased symptoms toward end of time       Shoulder Exercises: Stretch   Other Shoulder Stretches  3 way doorway stretch 20 sec x 2 each position       Moist Heat Therapy   Number Minutes Moist Heat  15 Minutes    Moist Heat Location  Cervical   thoracic     Electrical Stimulation   Electrical Stimulation Location  Lt cervical     Electrical Stimulation Action  IFC    Electrical Stimulation Parameters  to tolerance    Electrical Stimulation Goals  Pain;Tone      Manual Therapy   Manual therapy comments  pt prone and supine     Joint Mobilization  cervical CPA mobs increased symptoms     Soft tissue mobilization  deep tissue work through [place] posterior lateral cervical spine; deep tissue work Lt ant/lat/post cervical  musculature                   PT Long Term Goals - 11/08/17 0947      PT LONG TERM GOAL #1   Title  Improve posture and alignment with patient to demonstrate improved upright posture with good axial extension without radicular symptoms 12/20/17    Time  6    Period  Weeks    Status  New      PT LONG TERM GOAL #2   Title  Decrease frequency; intensity; duration of Lt UE pain and radicular symptoms by 75-100% allowing patient to perfrom normal daily activities without pain/limitations 12/20/17    Time  6    Period  Weeks    Status  New      PT LONG TERM GOAL #3   Title  Increase cervical ROM in Lt lateral flexion and rotation to equal Rt without radicular symptoms 12/20/17    Time  6    Period  Weeks    Status  New      PT LONG TERM GOAL #4   Title  Independent in HEP 12/20/17    Time  6    Period  Weeks    Status  New      PT LONG TERM GOAL #5   Title  Imporve FOTO to </= 29% limitation 12/20/17    Time  6    Period  Weeks    Status  New            Plan - 11/21/17 1706    Clinical Impression Statement  Tyrone Williams reports continued intermittent pain in the Lt shoulder and Lt UE which is worse with certain movements/activities and generally worse toward the end of the day. He demonstrates some improvement in posture but continues to sit and stand with head forward and to the left. He has increased cervical ROM and some decreased muscular tightness to palpation through the cervical and thoracic cervical musculature. We have tried DN/manual work; manual cervical traction which increases pain making him a poor canditate for mechanical traction.  We have tries all modalities and several exercises which are poorly tolerated. At this time Tyrone Williams has persistent cervical radicular symtpoms which are not responding to PT.     Rehab Potential  Good    PT Frequency  2x / week    PT Duration  6 weeks    PT Treatment/Interventions  Patient/family education;ADLs/Self Care Home  Management;Cryotherapy;Electrical Stimulation;Iontophoresis 50m/ml Dexamethasone;Moist Heat;Traction;Ultrasound;Spinal Manipulations;Manual techniques;Neuromuscular re-education;Therapeutic activities;Therapeutic exercise    PT Next Visit Plan  continue with postural correction; ROM; stretching; deep tissue work/DN cervical and shoulder girdle area; HEP; modalities as indicated Continue DN lt lateral cervical mm measure for MD note     Consulted and Agree with Plan of Care  Patient       Patient will benefit from skilled therapeutic intervention in order to improve the following deficits and impairments:  Postural dysfunction, Improper body mechanics, Pain, Increased fascial restricitons, Increased muscle spasms, Decreased mobility, Decreased range of motion, Decreased activity tolerance  Visit Diagnosis: Radiculopathy, cervical region  Other symptoms and signs involving the musculoskeletal system  Abnormal posture     Problem List Patient Active Problem List   Diagnosis Date Noted  . Cervical radiculitis 10/29/2017    Kamry Faraci PNilda SimmerPT, MPH  11/21/2017, 5:46 PM  [facility] [place] [address]Phone: [phone]  Fax:  [phone] Name: SDemarcus ThielkeMRN: [id number] of Birth: [birth year] PHYSICAL THERAPY DISCHARGE SUMMARY  Visits from Start of Care: 5  Current functional level related to goals / functional outcomes: See progress note for discharge status    Remaining deficits: Unknown     Education / Equipment: HEP  Plan: Patient agrees to discharge.  Patient goals were partially met. Patient is being discharged due to being pleased with the current functional level.  ?????     Arabelle Bollig P. HHelene KelpPT, MPH 12/25/17 3:21 PM

## 2017-11-25 ENCOUNTER — Encounter: Payer: BLUE CROSS/BLUE SHIELD | Admitting: Rehabilitative and Restorative Service Providers"

## 2017-11-25 ENCOUNTER — Ambulatory Visit: Payer: BLUE CROSS/BLUE SHIELD | Admitting: Family Medicine

## 2017-11-25 VITALS — BP 143/88 | HR 74 | Ht 72.0 in | Wt 254.0 lb

## 2017-11-25 DIAGNOSIS — M5412 Radiculopathy, cervical region: Secondary | ICD-10-CM | POA: Diagnosis not present

## 2017-11-25 NOTE — Patient Instructions (Signed)
Thank you for coming in today. You should hear from MRI soon.  Let me know if you do not hear from anyone.  Next step is likely going to be an injection after MRI.  I will contact you with results.  If simple we can do that over the phone if more complicated I may ask you to come back in person.     Cervical Radiculopathy Cervical radiculopathy means that a nerve in the neck is pinched or bruised. This can cause pain or loss of feeling (numbness) that runs from your neck to your arm and fingers. Follow these instructions at home: Managing pain  Take over-the-counter and prescription medicines only as told by your doctor.  If directed, put ice on the injured or painful area. ? Put ice in a plastic bag. ? Place a towel between your skin and the bag. ? Leave the ice on for 20 minutes, 2-3 times per day.  If ice does not help, you can try using heat. Take a warm shower or warm bath, or use a heat pack as told by your doctor.  You may try a gentle neck and shoulder massage. Activity  Rest as needed. Follow instructions from your doctor about any activities to avoid.  Do exercises as told by your doctor or physical therapist. General instructions  If you were given a soft collar, wear it as told by your doctor.  Use a flat pillow when you sleep.  Keep all follow-up visits as told by your doctor. This is important. Contact a doctor if:  Your condition does not improve with treatment. Get help right away if:  Your pain gets worse and is not controlled with medicine.  You lose feeling or feel weak in your hand, arm, face, or leg.  You have a fever.  You have a stiff neck.  You cannot control when you poop or pee (have incontinence).  You have trouble with walking, balance, or talking. This information is not intended to replace advice given to you by your health care provider. Make sure you discuss any questions you have with your health care provider. Document Released:  12/21/2010 Document Revised: 06/09/2015 Document Reviewed: 02/25/2014 Elsevier Interactive Patient Education  [employer].

## 2017-11-25 NOTE — Progress Notes (Signed)
Tyrone Williams is a 52 y.o. male who presents to [facility] today for one-month follow-up on cervical radiculitis. Tyrone Williams has gone to five sessions of physical therapy and been taking gabapentin 3 times per day. He reports that the PT has helped some with the pain, but "it's not fixing it." He reports having less pain but more tingling down his left arm and into his 4th and 5th fingers. He is wondering if he can increase his dose of gabapentin. He is ready to try something new to help resolve the pain.   ROS:  As above  Exam:  BP (!) 143/88   Pulse 74   Ht 6' (1.829 m)   Wt 254 lb (115.2 kg)   BMI 34.45 kg/m  General: Well Developed, well nourished, and in no acute distress.  Neuro/Psych: Alert and oriented x3, extra-ocular muscles intact, able to move all 4 extremities, sensation grossly intact. Skin: Warm and dry, no rashes noted.  Respiratory: Not using accessory muscles, speaking in full sentences, trachea midline.  Cardiovascular: Pulses palpable, no extremity edema. Abdomen: Does not appear distended.    Lab and Radiology Results CLINICAL DATA:  Left upper extremity pain  EXAM: CERVICAL SPINE - COMPLETE 4+ VIEW  COMPARISON:  None.  FINDINGS: Uncovertebral spurring causes mild to moderate left neural foraminal narrowing from C3-4 through C6-7 and mild right neural foraminal narrowing at C3-4 and C4-5. Degenerative disc disease at C4-5 and C5-6 with disc space narrowing and spurring. Mild bilateral degenerative facet disease, left greater than right. Normal alignment. No fracture. Prevertebral soft tissues are normal.  IMPRESSION: Bilateral neural foraminal narrowing, left greater than right. Degenerative disc and facet disease. No acute bony abnormality.   Electronically Signed   By: Charlett Nose M.D.   On: 10/21/2017 09:28    Assessment and Plan: 52 y.o. male with  Cervical radiculitis: Tyrone Williams has had  some improvement in pain with PT, but this has not resolved his pain and tingling. At this point, will order MRI to determine the cause. Tyrone Williams is somewhat apprehensive about MRI, as he had a past experience in a closed MRI machine that caused him pain. Told him that he will be able to lay flat in our MRI machine, and he felt better about having it done here. Next step will likely be injection after MRI. Will contact pt with results of MRI. Also advised Tyrone Williams that he no longer needs to attend PT and can increase his gabapentin to a maximum of three tablets three times per day if needed.    Orders Placed This Encounter  Procedures  . MR Cervical Spine Wo Contrast    Standing Status:   Future    Standing Expiration Date:   01/26/2019    Order Specific Question:   What is the patient's sedation requirement?    Answer:   No Sedation    Order Specific Question:   Does the patient have a pacemaker or implanted devices?    Answer:   No    Order Specific Question:   Preferred imaging location?    Answer:   [facility] (table limit-350lbs)    Order Specific Question:   Radiology Contrast Protocol - do NOT remove file path    Answer:   [email or web address]     Historical information moved to improve visibility of documentation.  Past Medical History:  Diagnosis Date  . Arthritis    Past Surgical History:  Procedure Laterality Date  .  KNEE SURGERY     Social History   Tobacco Use  . Smoking status: Never Smoker  . Smokeless tobacco: Current User    Types: Snuff  Substance Use Topics  . Alcohol use: Not Currently   family history is not on file.  Medications: Current Outpatient Medications  Medication Sig Dispense Refill  . gabapentin (NEURONTIN) 300 MG capsule One tab PO qHS for a week, then BID for a week, then TID. May double weekly to a max of 3,600mg /day 180 capsule 3   No current facility-administered medications for this visit.    No  Known Allergies    Discussed warning signs or symptoms. Please see discharge instructions. Patient expresses understanding.  I personally was present and performed or re-performed the history, physical exam and medical decision-making activities of this service and have verified that the service and findings are accurately documented in the student's note. ___________________________________________ Clementeen Graham M.D., ABFM., CAQSM. Primary Care and Sports Medicine Adjunct Instructor of Family Medicine  [employer]

## 2017-11-27 ENCOUNTER — Encounter: Payer: BLUE CROSS/BLUE SHIELD | Admitting: Rehabilitative and Restorative Service Providers"

## 2017-12-02 ENCOUNTER — Encounter: Payer: BLUE CROSS/BLUE SHIELD | Admitting: Rehabilitative and Restorative Service Providers"

## 2017-12-03 ENCOUNTER — Telehealth: Payer: Self-pay | Admitting: Family Medicine

## 2017-12-03 NOTE — Telephone Encounter (Signed)
Pt is claustrophobic, requesting premeds. Routing. [facility]

## 2017-12-04 ENCOUNTER — Encounter: Payer: BLUE CROSS/BLUE SHIELD | Admitting: Rehabilitative and Restorative Service Providers"

## 2017-12-04 MED ORDER — LORAZEPAM 0.5 MG PO TABS: ORAL_TABLET | ORAL | 0 refills | Status: AC

## 2017-12-04 NOTE — Telephone Encounter (Signed)
Pt advised. Has driver set up

## 2017-12-04 NOTE — Telephone Encounter (Signed)
Ativan prescribed.  Take prior to MRI.  Patient will need a driver.

## 2017-12-16 ENCOUNTER — Ambulatory Visit: Payer: BLUE CROSS/BLUE SHIELD

## 2017-12-17 ENCOUNTER — Telehealth: Payer: Self-pay | Admitting: Family Medicine

## 2017-12-17 DIAGNOSIS — M5412 Radiculopathy, cervical region: Secondary | ICD-10-CM

## 2017-12-17 NOTE — Telephone Encounter (Signed)
Pt called clinic today advising he did not have the MRI done yesterday. Reports even with the medication he was unable to complete it. Questions what the next step is.

## 2017-12-18 NOTE — Telephone Encounter (Signed)
Pt advised and scheduled.

## 2017-12-18 NOTE — Addendum Note (Signed)
Addended by: Rodolph BongOREY, Renise Gillies S on: 12/18/2017 01:19 PM   Modules accepted: Orders

## 2017-12-18 NOTE — Telephone Encounter (Signed)
Patient unable to tolerate oral antianxiety medication with MRI.  Will need moderate sedation versus general anesthesia. [facility] or if patient unable to then [facility] will [facility]

## 2018-01-03 ENCOUNTER — Encounter (HOSPITAL_COMMUNITY): Payer: Self-pay | Admitting: *Deleted

## 2018-01-03 ENCOUNTER — Other Ambulatory Visit: Payer: Self-pay

## 2018-01-03 NOTE — Progress Notes (Signed)
Spoke with pt for pre-op call. Pt denies cardiac history, HTN or diabetes. H&P in chart dated 12/19/17.

## 2018-01-08 NOTE — Anesthesia Preprocedure Evaluation (Addendum)
Anesthesia Evaluation  Patient identified by MRN, date of birth, ID band Patient awake    Reviewed: Allergy & Precautions, H&P , NPO status , Patient's Chart, lab work & pertinent test results  Airway Mallampati: II  TM Distance: >3 FB Neck ROM: Full    Dental no notable dental hx. (+) Teeth Intact, Dental Advisory Given   Pulmonary neg pulmonary ROS,    Pulmonary exam normal breath sounds clear to auscultation       Cardiovascular negative cardio ROS Normal cardiovascular exam Rhythm:Regular Rate:Normal     Neuro/Psych Cervical radiculopathy  Neuromuscular disease negative neurological ROS  negative psych ROS   GI/Hepatic negative GI ROS, Neg liver ROS,   Endo/Other  negative endocrine ROS  Renal/GU negative Renal ROS     Musculoskeletal  (+) Arthritis ,   Abdominal (+) + obese,   Peds  Hematology negative hematology ROS (+)   Anesthesia Other Findings   Reproductive/Obstetrics                            Anesthesia Physical Anesthesia Plan  ASA: I  Anesthesia Plan: General   Post-op Pain Management:    Induction: Intravenous  PONV Risk Score and Plan: 2 and Treatment may vary due to age or medical condition, Ondansetron and Dexamethasone  Airway Management Planned: LMA  Additional Equipment:   Intra-op Plan:   Post-operative Plan: Extubation in OR  Informed Consent: I have reviewed the patients History and Physical, chart, labs and discussed the procedure including the risks, benefits and alternatives for the proposed anesthesia with the patient or authorized representative who has indicated his/her understanding and acceptance.   Dental advisory given  Plan Discussed with:   Anesthesia Plan Comments:        Anesthesia Quick Evaluation

## 2018-01-09 ENCOUNTER — Ambulatory Visit (HOSPITAL_COMMUNITY)
Admission: RE | Admit: 2018-01-09 | Discharge: 2018-01-09 | Disposition: A | Discharge status: Home / Self Care | Payer: BLUE CROSS/BLUE SHIELD | Source: Ambulatory Visit | Attending: Family Medicine | Admitting: Family Medicine

## 2018-01-09 ENCOUNTER — Other Ambulatory Visit: Payer: Self-pay

## 2018-01-09 ENCOUNTER — Telehealth: Payer: Self-pay | Admitting: Family Medicine

## 2018-01-09 ENCOUNTER — Encounter (HOSPITAL_COMMUNITY): Payer: Self-pay | Admitting: Certified Registered Nurse Anesthetist

## 2018-01-09 ENCOUNTER — Ambulatory Visit (HOSPITAL_COMMUNITY): Payer: BLUE CROSS/BLUE SHIELD | Admitting: Certified Registered Nurse Anesthetist

## 2018-01-09 ENCOUNTER — Encounter (HOSPITAL_COMMUNITY)
Admission: RE | Disposition: A | Discharge status: Home / Self Care | Payer: Self-pay | Source: Ambulatory Visit | Attending: Family Medicine

## 2018-01-09 DIAGNOSIS — M5412 Radiculopathy, cervical region: Secondary | ICD-10-CM

## 2018-01-09 DIAGNOSIS — M50222 Other cervical disc displacement at C5-C6 level: Secondary | ICD-10-CM | POA: Diagnosis not present

## 2018-01-09 DIAGNOSIS — M4802 Spinal stenosis, cervical region: Secondary | ICD-10-CM | POA: Diagnosis not present

## 2018-01-09 DIAGNOSIS — M50223 Other cervical disc displacement at C6-C7 level: Secondary | ICD-10-CM | POA: Diagnosis not present

## 2018-01-09 HISTORY — PX: RADIOLOGY WITH ANESTHESIA: SHX6223

## 2018-01-09 SURGERY — MRI WITH ANESTHESIA
Anesthesia: General

## 2018-01-09 MED ORDER — LACTATED RINGERS IV SOLN
INTRAVENOUS | Status: DC
  Administered 2018-01-09: 07:00:00 via INTRAVENOUS

## 2018-01-09 MED ORDER — MIDAZOLAM HCL 2 MG/2ML IJ SOLN
INTRAMUSCULAR | Status: AC
  Filled 2018-01-09: qty 2

## 2018-01-09 MED ORDER — FENTANYL CITRATE (PF) 250 MCG/5ML IJ SOLN
INTRAMUSCULAR | Status: AC
  Filled 2018-01-09: qty 5

## 2018-01-09 MED ORDER — PROPOFOL 10 MG/ML IV BOLUS
INTRAVENOUS | Status: AC
  Filled 2018-01-09: qty 20

## 2018-01-09 NOTE — Telephone Encounter (Signed)
[facility] and left VM with patient information to call and schedule patient for epidural injection.

## 2018-01-09 NOTE — Telephone Encounter (Signed)
Based on MRI results.  We will proceed with epidural steroid injection.  We will get this ordered and arranged while we are in the process of scheduling a time to meet to discuss MRI results.

## 2018-01-09 NOTE — Transfer of Care (Signed)
Immediate Anesthesia Transfer of Care Note   Patient: Tyrone Williams  Procedure(s) Performed: MRI WITH ANESTHESIA OF CERVICAL SPINE WITHOUT CONTRAST (N/A )  Patient Location: PACU  Anesthesia Type:General  Level of Consciousness: awake, alert , oriented and patient cooperative  Airway & Oxygen Therapy: Patient Spontanous Breathing and Patient connected to nasal cannula oxygen  Post-op Assessment: Report given to RN and Post -op Vital signs reviewed and stable  Post vital signs: Reviewed and stable  Last Vitals:  Vitals Value Taken Time  BP 140/94 01/09/2018  9:15 AM  Temp 36.6 C 01/09/2018  9:15 AM  Pulse 65 01/09/2018  9:21 AM  Resp 12 01/09/2018  9:21 AM  SpO2 94 % 01/09/2018  9:21 AM  Vitals shown include unvalidated device data.  Last Pain:  Vitals:   01/09/18 0915  TempSrc:   PainSc: 0-No pain      Patients Stated Pain Goal: 3 (01/09/18 [id number])  Complications: No apparent anesthesia complications

## 2018-01-09 NOTE — Anesthesia Procedure Notes (Signed)
Procedure Name: LMA Insertion Date/Time: 01/09/2018 8:15 AM Performed by: Adonis Housekeeperongell, Neshawn Aird M, CRNA Pre-anesthesia Checklist: Patient identified, Emergency Drugs available, Suction available and Patient being monitored Patient Re-evaluated:Patient Re-evaluated prior to induction Oxygen Delivery Method: Circle system utilized Preoxygenation: Pre-oxygenation with 100% oxygen Induction Type: IV induction Ventilation: Mask ventilation without difficulty LMA: LMA inserted LMA Size: 5.0 Number of attempts: 1 Placement Confirmation: positive ETCO2 and breath sounds checked- equal and bilateral Tube secured with: Tape Dental Injury: Teeth and Oropharynx as per pre-operative assessment

## 2018-01-09 NOTE — Anesthesia Postprocedure Evaluation (Signed)
Anesthesia Post Note  Patient: Christena DeemScott A Stroud  Procedure(s) Performed: MRI WITH ANESTHESIA OF CERVICAL SPINE WITHOUT CONTRAST (N/A )     Patient location during evaluation: PACU Anesthesia Type: General Level of consciousness: awake and alert Pain management: pain level controlled Vital Signs Assessment: post-procedure vital signs reviewed and stable Respiratory status: spontaneous breathing, nonlabored ventilation, respiratory function stable and patient connected to nasal cannula oxygen Cardiovascular status: blood pressure returned to baseline and stable Postop Assessment: no apparent nausea or vomiting Anesthetic complications: no    Last Vitals:  Vitals:   01/09/18 0915 01/09/18 0930  BP: (!) 140/94 (!) 141/95  Pulse: 66 (!) 59  Resp: 13 16  Temp: 36.6 C   SpO2: 96% 97%    Last Pain:  Vitals:   01/09/18 0930  TempSrc:   PainSc: 0-No pain                 Trevor IhaStephen A Aman Batley

## 2018-01-10 ENCOUNTER — Encounter (HOSPITAL_COMMUNITY): Payer: Self-pay | Admitting: Radiology

## 2018-01-10 MED FILL — Lactated Ringer's Solution: INTRAVENOUS | Qty: 1000 | Status: AC

## 2018-01-10 MED FILL — Lidocaine HCl(Cardiac) IV PF Soln Pref Syr 100 MG/5ML (2%): INTRAVENOUS | Qty: 5 | Status: AC

## 2018-01-10 MED FILL — Ondansetron HCl Inj 4 MG/2ML (2 MG/ML): INTRAMUSCULAR | Qty: 2 | Status: AC

## 2018-01-10 MED FILL — Midazolam HCl Inj 2 MG/2ML (Base Equivalent): INTRAMUSCULAR | Qty: 2 | Status: AC

## 2018-01-10 MED FILL — Fentanyl Citrate Preservative Free (PF) Inj 100 MCG/2ML: INTRAMUSCULAR | Qty: 2 | Status: AC

## 2018-01-10 MED FILL — Propofol IV Emul 200 MG/20ML (10 MG/ML): INTRAVENOUS | Qty: 20 | Status: AC

## 2018-01-13 ENCOUNTER — Encounter: Payer: Self-pay | Admitting: Family Medicine

## 2018-01-13 ENCOUNTER — Ambulatory Visit: Payer: BLUE CROSS/BLUE SHIELD | Admitting: Family Medicine

## 2018-01-13 VITALS — BP 134/81 | HR 86 | Wt 260.0 lb

## 2018-01-13 DIAGNOSIS — M5412 Radiculopathy, cervical region: Secondary | ICD-10-CM | POA: Diagnosis not present

## 2018-01-13 NOTE — Patient Instructions (Addendum)
Thank you for coming in today.  Attend the injection.  Let me know you are feeling   Recheck as needed.   Epidural Steroid Injection Patient Information  Description: The epidural space surrounds the nerves as they exit the spinal cord.  In some patients, the nerves can be compressed and inflamed by a bulging disc or a tight spinal canal (spinal stenosis).  By injecting steroids into the epidural space, we can bring irritated nerves into direct contact with a potentially helpful medication.  These steroids act directly on the irritated nerves and can reduce swelling and inflammation which often leads to decreased pain.  Epidural steroids may be injected anywhere along the spine and from the neck to the low back depending upon the location of your pain.   After numbing the skin with local anesthetic (like Novocaine), a small needle is passed into the epidural space slowly.  You may experience a sensation of pressure while this is being done.  The entire block usually last less than 10 minutes.  Conditions which may be treated by epidural steroids:   Low back and leg pain  Neck and arm pain  Spinal stenosis  Post-laminectomy syndrome  Herpes zoster (shingles) pain  Pain from compression fractures  Preparation for the injection:  1. Do not eat any solid food or dairy products within 8 hours of your appointment.  2. You may drink clear liquids up to 3 hours before appointment.  Clear liquids include water, black coffee, juice or soda.  No milk or cream please. 3. You may take your regular medication, including pain medications, with a sip of water before your appointment  Diabetics should hold regular insulin (if taken separately) and take 1/2 normal NPH dos the morning of the procedure.  Carry some sugar containing items with you to your appointment. 4. A driver must accompany you and be prepared to drive you home after your procedure.  5. Bring all your current medications with  your. 6. An IV may be inserted and sedation may be given at the discretion of the physician.   7. A blood pressure cuff, EKG and other monitors will often be applied during the procedure.  Some patients may need to have extra oxygen administered for a short period. 8. You will be asked to provide medical information, including your allergies, prior to the procedure.  We must know immediately if you are taking blood thinners (like Coumadin/Warfarin)  Or if you are allergic to IV iodine contrast (dye). We must know if you could possible be pregnant.  Possible side-effects:  Bleeding from needle site  Infection (rare, may require surgery)  Nerve injury (rare)  Numbness & tingling (temporary)  Difficulty urinating (rare, temporary)  Spinal headache ( a headache worse with upright posture)  Light -headedness (temporary)  Pain at injection site (several days)  Decreased blood pressure (temporary)  Weakness in arm/leg (temporary)  Pressure sensation in back/neck (temporary)  Call if you experience:  Fever/chills associated with headache or increased back/neck pain.  Headache worsened by an upright position.  New onset weakness or numbness of an extremity below the injection site  Hives or difficulty breathing (go to the emergency room)  Inflammation or drainage at the infection site  Severe back/neck pain  Any new symptoms which are concerning to you  Please note:  Although the local anesthetic injected can often make your back or neck feel good for several hours after the injection, the pain will likely return.  It takes 3-7  days for steroids to work in the epidural space.  You may not notice any pain relief for at least that one week.  If effective, we will often do a series of three injections spaced 3-6 weeks apart to maximally decrease your pain.  After the initial series, we generally will wait several months before considering a repeat injection of the same  type.  If you have any questions, please call [phone] [facility]

## 2018-01-14 NOTE — Progress Notes (Signed)
Christena DeemScott A Ertel is a 52 y.o. male who [facility] today for cervical radiculopathy.  Lorin PicketScott has been seen several times for pain radiating down his left arm associated with numbness and tingling.  He failed conservative management and ultimately had MRI which showed likely C7 radiculopathy down the left side  He is here for follow-up.  He is already been scheduled for epidural steroid injection in early January.  He notes with gabapentin his pain is better controlled but he still does have numbness and tingling down his left arm.  He denies any weakness.    ROS:  As above  Exam:  BP 134/81   Pulse 86   Wt 260 lb (117.9 kg)   BMI 35.26 kg/m  General: Well Developed, well nourished, and in no acute distress.  Neuro/Psych: Alert and oriented x3, extra-ocular muscles intact, able to move all 4 extremities, sensation grossly intact. Skin: Warm and dry, no rashes noted.  Respiratory: Not using accessory muscles, speaking in full sentences, trachea midline.  Cardiovascular: Pulses palpable, no extremity edema. Abdomen: Does not appear distended. MSK: C-spine nontender normal motion mildly positive Spurling's test.  Upper extremity strength is intact.    Lab and Radiology Results Dg Cervical Spine Complete  Result Date: 10/21/2017 CLINICAL DATA:  Left upper extremity pain EXAM: CERVICAL SPINE - COMPLETE 4+ VIEW COMPARISON:  None. FINDINGS: Uncovertebral spurring causes mild to moderate left neural foraminal narrowing from C3-4 through C6-7 and mild right neural foraminal narrowing at C3-4 and C4-5. Degenerative disc disease at C4-5 and C5-6 with disc space narrowing and spurring. Mild bilateral degenerative facet disease, left greater than right. Normal alignment. No fracture. Prevertebral soft tissues are normal. IMPRESSION: Bilateral neural foraminal narrowing, left greater than right. Degenerative disc and facet disease. No acute bony  abnormality. Electronically Signed   By: Charlett NoseKevin  Dover M.D.   On: 10/21/2017 09:28   Mr Cervical Spine Wo Contrast  Result Date: 01/09/2018 CLINICAL DATA:  52 year old male with 2 months of pain in the left lateral, posterior neck radiating to the shoulder, left arm and ulnar aspect of the left hand. No improvement with physical therapy. Study performed under general anesthesia. EXAM: MRI CERVICAL SPINE WITHOUT CONTRAST TECHNIQUE: Multiplanar, multisequence MR imaging of the cervical spine was performed. No intravenous contrast was administered. COMPARISON:  Cervical spine radiographs 10/21/2017. FINDINGS: Alignment: Stable since October. Mild reversal of cervical lordosis. Occasional trace spondylolisthesis, including retrolisthesis of C5 on C6. Vertebrae: No marrow edema or evidence of acute osseous abnormality. Visualized bone marrow signal is within normal limits. Cord: Spinal cord signal is within normal limits at all visualized levels. Posterior Fossa, vertebral arteries, paraspinal tissues: Cervicomedullary junction is within normal limits. Negative visible posterior fossa. Preserved major vascular flow voids in the neck. Partially visible pharyngeal airway in the setting of anesthesia. Negative neck soft tissues. Disc levels: C2-C3: Mildly lobulated left paracentral disc herniation with annular fissure (series 16, image 8). Fairly capacious spinal canal at this level but the left ventral hemicord is effaced by disc and mildly deformed. Mild left C3 foraminal involvement. C3-C4: Small central disc protrusion (series 16, image 14) superimposed on circumferential disc bulge and endplate spurring. Mild facet hypertrophy greater on the left. Effaced ventral CSF space without significant spinal stenosis. Moderate to severe left C4 foraminal stenosis. Mild to moderate right foraminal stenosis. C4-C5: Circumferential disc bulge and endplate spurring. Mild facet hypertrophy. No spinal stenosis. Moderate bilateral  C6 foraminal stenosis. C5-C6: Left eccentric  circumferential disc bulge and endplate spurring. Mild facet hypertrophy. Moderate to severe left C6 foraminal stenosis without spinal or right foraminal stenosis. C6-C7: Circumferential disc bulge with some endplate spurring but suspected left foraminal disc herniation on series 13, image 14. Mild facet and ligament flavum hypertrophy. Mild spinal stenosis without spinal cord mass effect. Severe left and moderate right C7 foraminal stenosis. C7-T1:  Mild facet hypertrophy.  No stenosis. Visible upper thoracic levels remarkable for foraminal disc bulging and endplate spurring at T2-T3 greater on the right. IMPRESSION: 1. Symptomatic level favored to be C6-C7, where a left foraminal disc herniation is suspected and superimposed on mild spinal stenosis. Subsequent severe left and moderate right foraminal stenosis. Query left C7 radiculitis. 2. There is also moderate to severe neural foraminal stenosis at C3-C4 and C5-C6 related to disc, endplate, and facet degeneration. 3. Fairly capacious underlying cervical spinal canal, such that disc herniations at C2-C3 and C3-C4 do not result in significant spinal stenosis despite effaced ventral CSF space. There is mild mass effect on the left hemi cord at C2-C3, no cord signal abnormality. Electronically Signed   By: Odessa FlemingH  Hall M.D.   On: 01/09/2018 09:32   I personally (independently) visualized and performed the interpretation of the images attached in this note.      Assessment and Plan: 52 y.o. male with left-sided cervical radiculopathy.  Plan for epidural steroid injection and recheck as needed.  Discussion regarding MRI results treatment options and plan and next steps.  I spent 15 minutes with this patient, greater than 50% was face-to-face time counseling regarding as above.     No orders of the defined types were placed in this encounter.  No orders of the defined types were placed in this  encounter.   Historical information moved to improve visibility of documentation.  Past Medical History:  Diagnosis Date  . Arthritis    Past Surgical History:  Procedure Laterality Date  . KNEE SURGERY    . RADIOLOGY WITH ANESTHESIA N/A 01/09/2018   Procedure: MRI WITH ANESTHESIA OF CERVICAL SPINE WITHOUT CONTRAST;  Surgeon: Radiologist, Medication, MD;  Location: [facility] OR;  Service: Radiology;  Laterality: N/A;   Social History   Tobacco Use  . Smoking status: Never Smoker  . Smokeless tobacco: Current User    Types: Snuff  Substance Use Topics  . Alcohol use: Not Currently   family history is not on file.  Medications: Current Outpatient Medications  Medication Sig Dispense Refill  . gabapentin (NEURONTIN) 300 MG capsule One tab PO qHS for a week, then BID for a week, then TID. May double weekly to a max of 3,600mg /day (Patient taking differently: Take 300 mg by mouth 3 (three) times daily. ) 180 capsule 3  . Glucosamine-Chondroitin (OSTEO BI-FLEX REGULAR STRENGTH PO) Take 1 tablet by mouth 2 (two) times daily.    Marland Kitchen. ibuprofen (ADVIL,MOTRIN) 200 MG tablet Take 400 mg by mouth daily as needed for headache or moderate pain.    Marland Kitchen. LORazepam (ATIVAN) 0.5 MG tablet 1-2 tabs 30 - 60 min prior to MRI. Do not drive with this medicine. 4 tablet 0   No current facility-administered medications for this visit.    No Known Allergies    Discussed warning signs or symptoms. Please see discharge instructions. Patient expresses understanding.

## 2018-01-22 ENCOUNTER — Other Ambulatory Visit: Payer: BLUE CROSS/BLUE SHIELD

## 2018-01-23 ENCOUNTER — Ambulatory Visit
Admission: RE | Admit: 2018-01-23 | Discharge: 2018-01-23 | Disposition: A | Discharge status: Home / Self Care | Payer: BLUE CROSS/BLUE SHIELD | Source: Ambulatory Visit | Attending: Family Medicine | Admitting: Family Medicine

## 2018-01-23 DIAGNOSIS — M50223 Other cervical disc displacement at C6-C7 level: Secondary | ICD-10-CM | POA: Diagnosis not present

## 2018-01-23 MED ORDER — IOPAMIDOL (ISOVUE-M 200) INJECTION 41%
1.0000 mL | Freq: Once | INTRAMUSCULAR | Status: AC
  Administered 2018-01-23: 1 mL via EPIDURAL

## 2018-01-23 MED ORDER — METHYLPREDNISOLONE ACETATE 40 MG/ML INJ SUSP (RADIOLOG
120.0000 mg | Freq: Once | INTRAMUSCULAR | Status: AC
  Administered 2018-01-23: 120 mg via EPIDURAL

## 2018-01-23 NOTE — Discharge Instructions (Signed)

## 2019-12-29 IMAGING — DX DG CERVICAL SPINE COMPLETE 4+V
6 series · 6 of 6 positions shown · non-contrast
Comparison: None.

CLINICAL DATA: Left upper extremity pain

EXAM:
CERVICAL SPINE - COMPLETE 4+ VIEW

[c-spine lat]
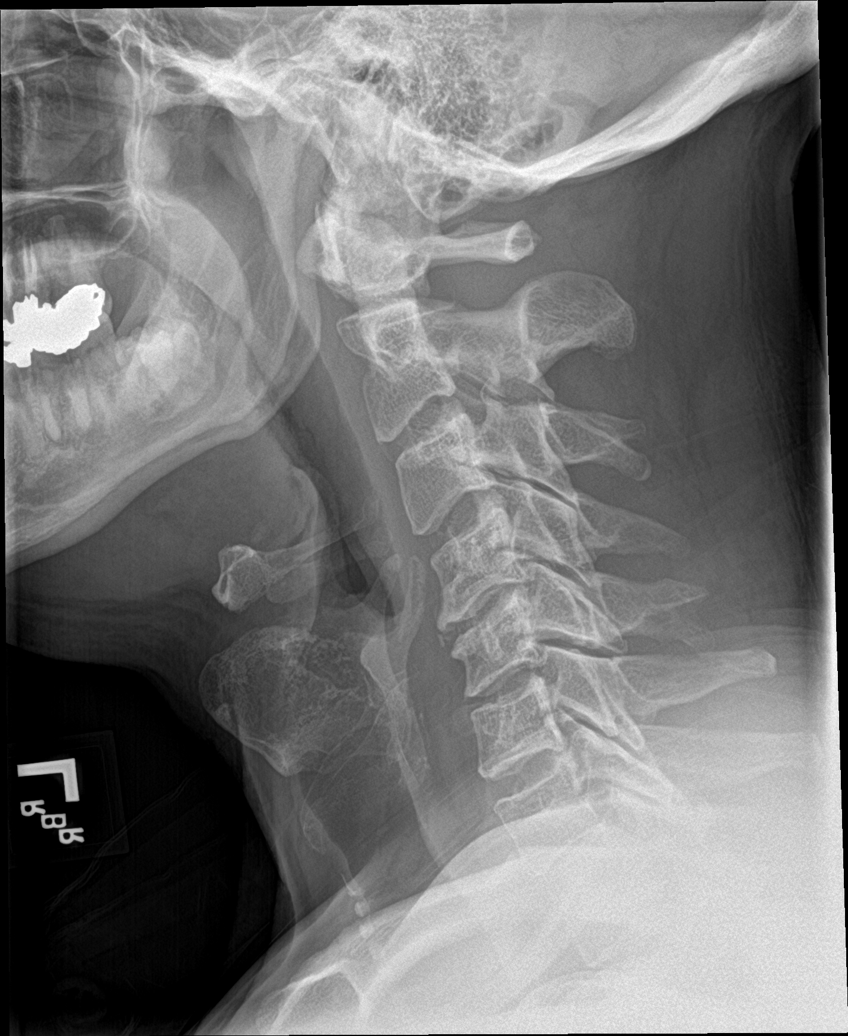

[c-spine obl (1 of 2)]
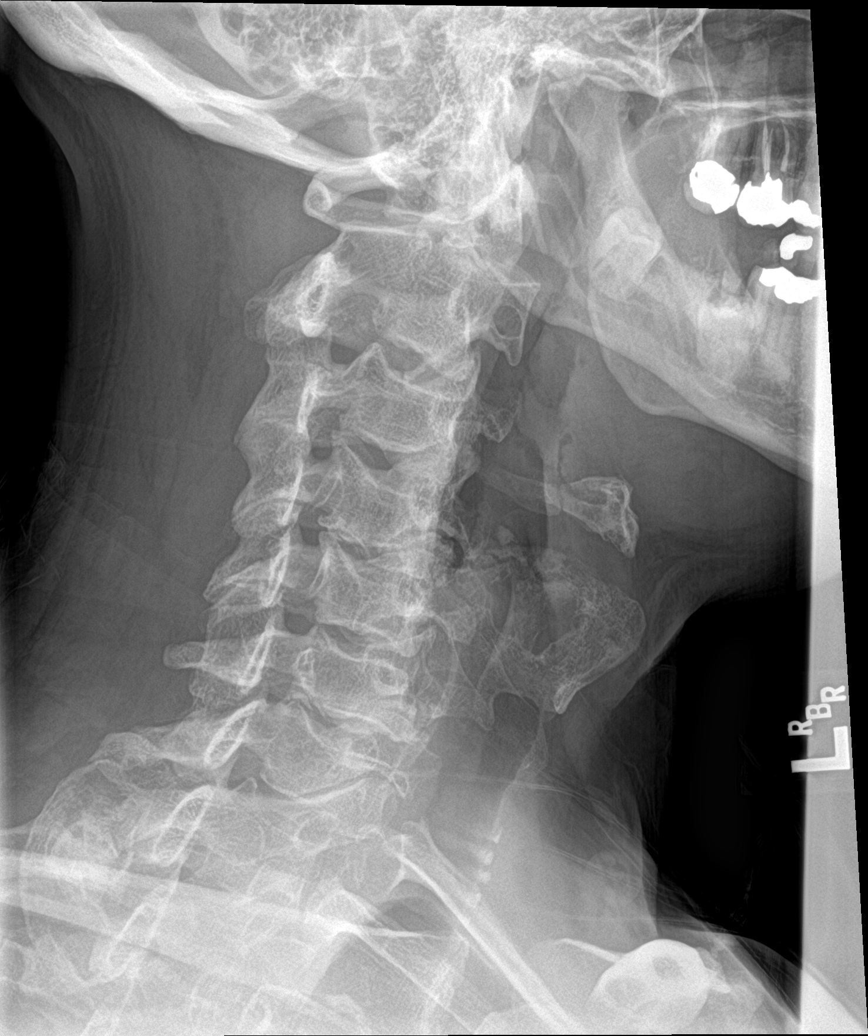

[c-spine obl (2 of 2)]
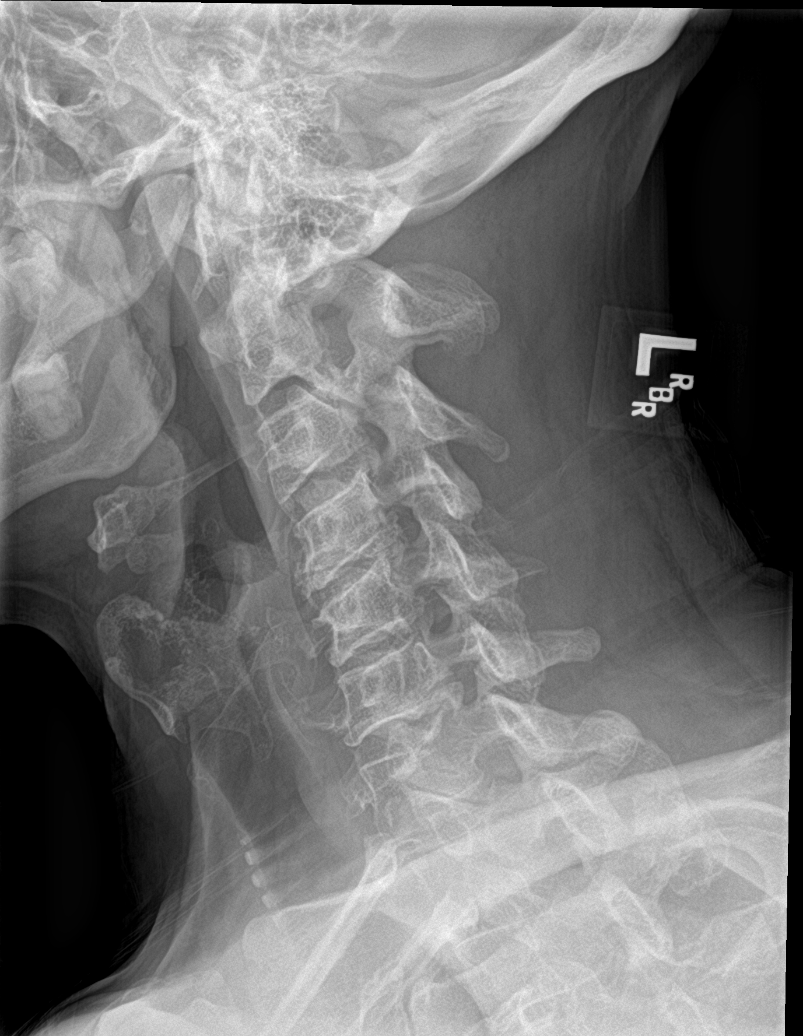

[c-spine ap]
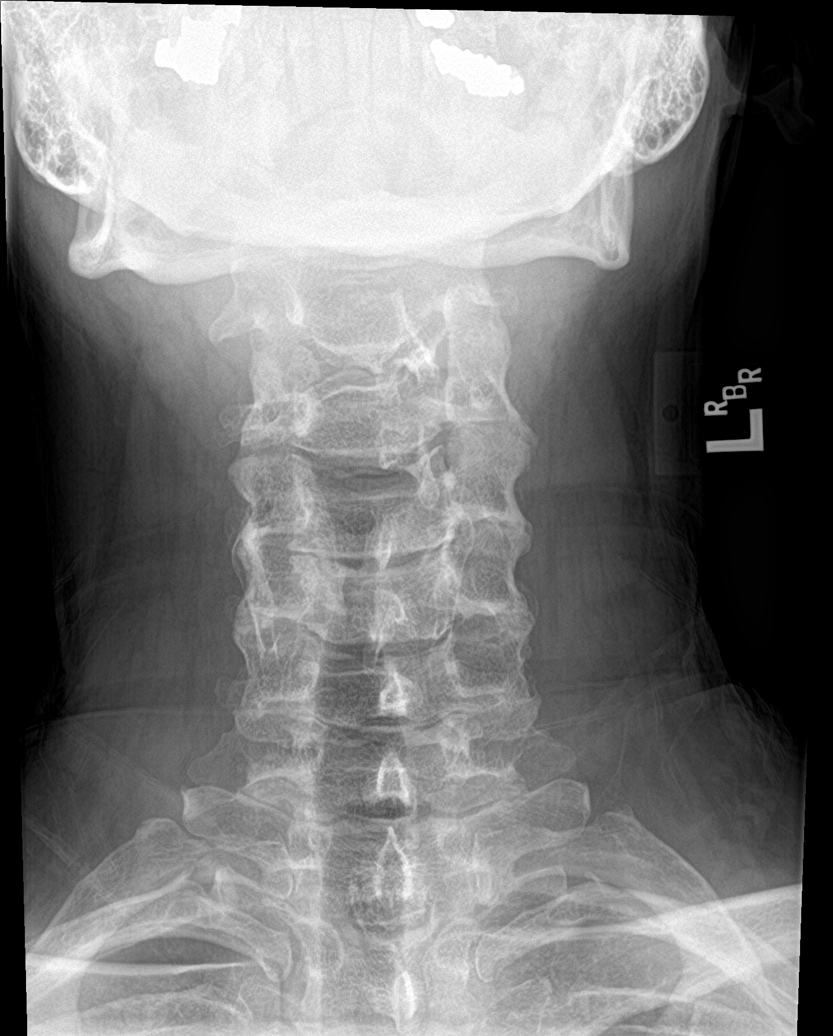

[c-spine open mouth]
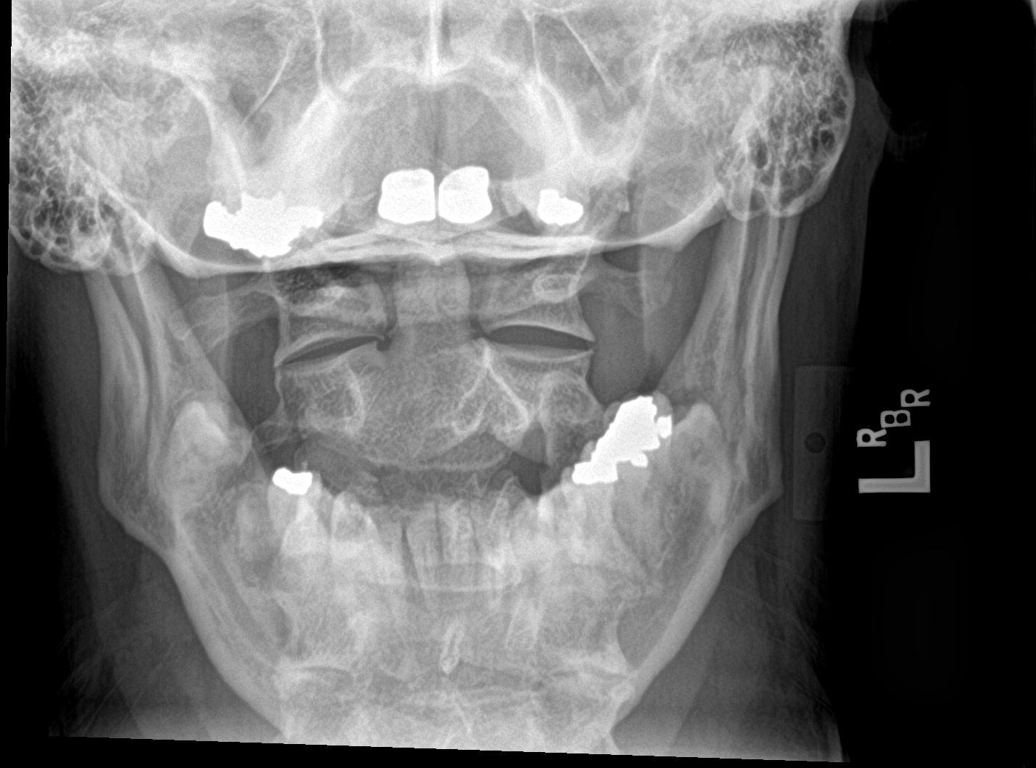

[c-spine swimmers]
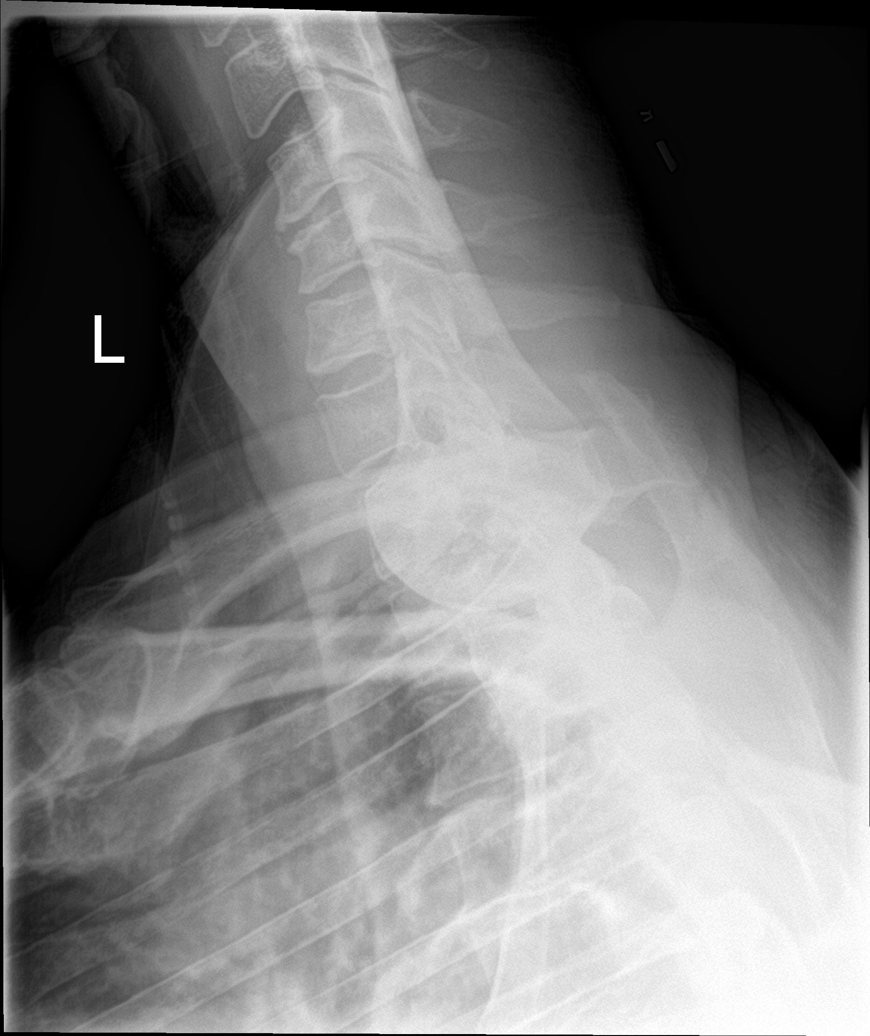

[6 of 6 positions shown; findings below may reference images not displayed]

FINDINGS: Uncovertebral spurring causes mild to moderate left neural foraminal
narrowing from C3-4 through C6-7 and mild right neural foraminal
narrowing at C3-4 and C4-5. Degenerative disc disease at C4-5 and
C5-6 with disc space narrowing and spurring. Mild bilateral
degenerative facet disease, left greater than right. Normal
alignment. No fracture. Prevertebral soft tissues are normal.
IMPRESSION: Bilateral neural foraminal narrowing, left greater than right.
Degenerative disc and facet disease. No acute bony abnormality.
